# Patient Record
Sex: Male | Born: 1985 | Race: Black or African American | Hispanic: No | Marital: Single | Smoking: Never smoker
Health system: Southern US, Community
[De-identification: ages and names within clinical notes are randomized; demographics above are authoritative.]

---
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
## 2018-04-27 ENCOUNTER — Other Ambulatory Visit: Payer: Self-pay

## 2018-04-27 ENCOUNTER — Emergency Department (HOSPITAL_BASED_OUTPATIENT_CLINIC_OR_DEPARTMENT_OTHER): Payer: Self-pay

## 2018-04-27 ENCOUNTER — Emergency Department (HOSPITAL_BASED_OUTPATIENT_CLINIC_OR_DEPARTMENT_OTHER)
Admission: EM | Admit: 2018-04-27 | Discharge: 2018-04-27 | Disposition: A | Discharge status: Home / Self Care | Payer: Self-pay | Attending: Emergency Medicine | Admitting: Emergency Medicine

## 2018-04-27 ENCOUNTER — Encounter (HOSPITAL_BASED_OUTPATIENT_CLINIC_OR_DEPARTMENT_OTHER): Payer: Self-pay

## 2018-04-27 DIAGNOSIS — K859 Acute pancreatitis without necrosis or infection, unspecified: Secondary | ICD-10-CM | POA: Insufficient documentation

## 2018-04-27 LAB — COMPREHENSIVE METABOLIC PANEL
ALT: 25 U/L (ref 0–44)
AST: 22 U/L (ref 15–41)
Albumin: 3.9 g/dL (ref 3.5–5.0)
Alkaline Phosphatase: 44 U/L (ref 38–126)
Anion gap: 7 (ref 5–15)
BUN: 8 mg/dL (ref 6–20)
CO2: 28 mmol/L (ref 22–32)
Calcium: 9.6 mg/dL (ref 8.9–10.3)
Chloride: 103 mmol/L (ref 98–111)
Creatinine, Ser: 0.84 mg/dL (ref 0.61–1.24)
GFR calc Af Amer: >60 mL/min (ref >60)
GFR calc non Af Amer: >60 mL/min (ref >60)
Glucose, Bld: 137 mg/dL — ABNORMAL HIGH (ref 70–99)
Potassium: 4 mmol/L (ref 3.5–5.1)
Sodium: 138 mmol/L (ref 135–145)
Total Bilirubin: 0.5 mg/dL (ref 0.3–1.2)
Total Protein: 7.3 g/dL (ref 6.5–8.1)

## 2018-04-27 LAB — LIPASE, BLOOD: Lipase: 107 U/L — ABNORMAL HIGH (ref 11–51)

## 2018-04-27 LAB — URINALYSIS, ROUTINE W REFLEX MICROSCOPIC
BILIRUBIN URINE: NEGATIVE
Glucose, UA: NEGATIVE mg/dL
Hgb urine dipstick: NEGATIVE
Ketones, ur: NEGATIVE mg/dL
Leukocytes, UA: NEGATIVE
Nitrite: NEGATIVE
Protein, ur: NEGATIVE mg/dL
Specific Gravity, Urine: 1.02 (ref 1.005–1.030)
pH: 8 (ref 5.0–8.0)

## 2018-04-27 LAB — CBC
HCT: 46.4 % (ref 39.0–52.0)
Hemoglobin: 14.5 g/dL (ref 13.0–17.0)
MCH: 25.4 pg — ABNORMAL LOW (ref 26.0–34.0)
MCHC: 31.3 g/dL (ref 30.0–36.0)
MCV: 81.3 fL (ref 80.0–100.0)
Platelets: 271 10*3/uL (ref 150–400)
RBC: 5.71 MIL/uL (ref 4.22–5.81)
RDW: 13.3 % (ref 11.5–15.5)
WBC: 6.4 10*3/uL (ref 4.0–10.5)
nRBC: 0 % (ref 0.0–0.2)

## 2018-04-27 MED ORDER — ONDANSETRON HCL 4 MG/2ML IJ SOLN
4.0000 mg | Freq: Once | INTRAMUSCULAR | Status: AC
  Administered 2018-04-27: 4 mg via INTRAVENOUS
  Filled 2018-04-27: qty 2

## 2018-04-27 MED ORDER — SODIUM CHLORIDE 0.9 % IV BOLUS: 1000.0000 mL | Freq: Once | INTRAVENOUS | Status: DC

## 2018-04-27 MED ORDER — TRAMADOL HCL 50 MG PO TABS: 50.0000 mg | ORAL_TABLET | Freq: Four times a day (QID) | ORAL | 0 refills | Status: AC | PRN

## 2018-04-27 MED ORDER — PROMETHAZINE HCL 25 MG PO TABS: 25.0000 mg | ORAL_TABLET | Freq: Four times a day (QID) | ORAL | 0 refills | Status: AC | PRN

## 2018-04-27 MED ORDER — IOPAMIDOL (ISOVUE-300) INJECTION 61%
100.0000 mL | Freq: Once | INTRAVENOUS | Status: AC | PRN
  Administered 2018-04-27: 100 mL via INTRAVENOUS

## 2018-04-27 MED ORDER — KETOROLAC TROMETHAMINE 30 MG/ML IJ SOLN
30.0000 mg | Freq: Once | INTRAMUSCULAR | Status: AC
  Administered 2018-04-27: 30 mg via INTRAVENOUS
  Filled 2018-04-27: qty 1

## 2018-04-27 NOTE — ED Provider Notes (Signed)
[facility] EMERGENCY DEPARTMENT Provider Note   CSN: [id number] Arrival date & time: 04/27/18  1531     History   Chief Complaint Chief Complaint  Patient presents with  . Abdominal Pain    HPI Bryan Holder is a 33 y.o. male.  HPI Patient presents to the emergency department with abdominal discomfort that started 3 days ago.  Patient states he took Pepto-Bismol with some relief of his symptoms.  Patient states he had several episodes of nausea and vomiting.  The patient states that he is also had some diarrhea.  Patient states that his abdominal pain is mostly left-sided abdominal discomfort.  Patient states that nothing seems to make the condition better but palpation makes the pain worse.  The patient denies chest pain, shortness of breath, headache,blurred vision, neck pain, fever, cough, weakness, numbness, dizziness, anorexia, edema,rash, back pain, dysuria, hematemesis, bloody stool, near syncope, or syncope. History reviewed. No pertinent past medical history.  There are no active problems to display for this patient.   History reviewed. No pertinent surgical history.      Home Medications    Prior to Admission medications   Not on File    Family History No family history on file.  Social History Social History   Tobacco Use  . Smoking status: Never Smoker  . Smokeless tobacco: Never Used  Substance Use Topics  . Alcohol use: Yes    Comment: occ  . Drug use: Yes    Types: Marijuana     Allergies   Patient has no known allergies.   Review of Systems Review of Systems  All other systems negative except as documented in the HPI. All pertinent positives and negatives as reviewed in the HPI. Physical Exam Updated Vital Signs BP 107/64 (BP Location: Left Arm)   Pulse 77   Temp 98.1 F (36.7 C)   Resp 16   Ht 5\' 7"  (1.702 m)   Wt 94.3 kg   SpO2 98%   BMI 32.58 kg/m   Physical Exam Vitals signs and nursing note reviewed.    Constitutional:      General: He is not in acute distress.    Appearance: He is well-developed.  HENT:     Head: Normocephalic and atraumatic.  Eyes:     Pupils: Pupils are equal, round, and reactive to light.  Neck:     Musculoskeletal: Normal range of motion and neck supple.  Cardiovascular:     Rate and Rhythm: Normal rate and regular rhythm.     Heart sounds: Normal heart sounds. No murmur. No friction rub. No gallop.   Pulmonary:     Effort: Pulmonary effort is normal. No respiratory distress.     Breath sounds: Normal breath sounds. No wheezing.  Abdominal:     General: Bowel sounds are normal. There is no distension.     Palpations: Abdomen is soft.     Tenderness: There is abdominal tenderness in the periumbilical area and left upper quadrant.  Skin:    General: Skin is warm and dry.     Capillary Refill: Capillary refill takes less than 2 seconds.     Findings: No erythema or rash.  Neurological:     Mental Status: He is alert and oriented to person, place, and time.     Motor: No abnormal muscle tone.     Coordination: Coordination normal.  Psychiatric:        Behavior: Behavior normal.      ED Treatments /  Results  Labs (all labs ordered are listed, but only abnormal results are displayed) Labs Reviewed  LIPASE, BLOOD - Abnormal; Notable for the following components:      Result Value   Lipase 107 (*)    All other components within normal limits  COMPREHENSIVE METABOLIC PANEL - Abnormal; Notable for the following components:   Glucose, Bld 137 (*)    All other components within normal limits  CBC - Abnormal; Notable for the following components:   MCH 25.4 (*)    All other components within normal limits  URINALYSIS, ROUTINE W REFLEX MICROSCOPIC - Abnormal; Notable for the following components:   APPearance CLOUDY (*)    All other components within normal limits    EKG None  Radiology Ct Abdomen Pelvis W Contrast  Result Date: 04/27/2018 CLINICAL  DATA:  Generalized abdominal pain, nausea, vomiting, diarrhea. EXAM: CT ABDOMEN AND PELVIS WITH CONTRAST TECHNIQUE: Multidetector CT imaging of the abdomen and pelvis was performed using the standard protocol following bolus administration of intravenous contrast. CONTRAST:  100mL ISOVUE-300 IOPAMIDOL (ISOVUE-300) INJECTION 61% COMPARISON:  None. FINDINGS: Lower chest: No significant pulmonary nodules or acute consolidative airspace disease. Hepatobiliary: Normal liver size. No liver mass. Normal gallbladder with no radiopaque cholelithiasis. No biliary ductal dilatation. Pancreas: Normal, with no mass or duct dilation. Spleen: Normal size. No mass. Adrenals/Urinary Tract: Normal adrenals. Nonobstructing 6 mm lower right renal stone. No hydronephrosis. No renal masses. Normal bladder. Stomach/Bowel: Normal non-distended stomach. Normal caliber small bowel with no small bowel wall thickening. Normal appendix. There is slight haziness of the pericolonic fat throughout the right colon, without significant colonic wall thickening or diverticulosis. Vascular/Lymphatic: Normal caliber abdominal aorta. Patent portal, splenic, hepatic and renal veins. No pathologically enlarged lymph nodes in the abdomen or pelvis. Reproductive: Normal size prostate. Other: No pneumoperitoneum, ascites or focal fluid collection. Musculoskeletal: No aggressive appearing focal osseous lesions. IMPRESSION: 1. Slight haziness of the pericolonic fat throughout the right colon, without significant colonic wall thickening or diverticulosis. Cannot exclude a mild nonspecific infectious or inflammatory colitis. No bowel obstruction. 2. Nonobstructing right nephrolithiasis. Electronically Signed   By: Delbert PhenixJason A Poff M.D.   On: 04/27/2018 19:04    Procedures Procedures (including critical care time)  Medications Ordered in ED Medications  ondansetron (ZOFRAN) injection 4 mg (4 mg Intravenous Given 04/27/18 1750)  iopamidol (ISOVUE-300) 61 %  injection 100 mL (100 mLs Intravenous Contrast Given 04/27/18 1825)     Initial Impression / Assessment and Plan / ED Course  I have reviewed the triage vital signs and the nursing notes.  Pertinent labs & imaging results that were available during my care of the patient were reviewed by me and considered in my medical decision making (see chart for details).     Patient most likely has pancreatitis based on his elevated lipase.  Patient is advised to return here as needed.  Put on a clear liquid diet and advised to avoid alcohol.  Patient agrees the plan and all questions were answered.  The patient is been stable here in the emergency department I feel is safe for discharge at this time. Final Clinical Impressions(s) / ED Diagnoses   Final diagnoses:  None    ED Discharge Orders    None       Charlestine NightLawyer, Zaylan Kissoon, PA-C 04/27/18 1951    Loren RacerYelverton, David, MD 04/29/18 [id number]

## 2018-04-27 NOTE — ED Triage Notes (Signed)
C/o abd pain started yesterday-n/v/d x today-NAD-steady gait

## 2018-04-27 NOTE — ED Notes (Signed)
ED Provider at bedside. 

## 2018-04-27 NOTE — ED Notes (Signed)
Pt c/o n/v/d since last night. Pt states abdominal pain with diarrhea and emesis. Last emesis episode at 10am today.

## 2018-04-27 NOTE — Discharge Instructions (Addendum)
Return here as needed. Follow up with a primary doctor. °

## 2018-04-27 NOTE — ED Notes (Signed)
Patient transported to CT 

## 2018-09-09 ENCOUNTER — Other Ambulatory Visit: Payer: Self-pay

## 2018-09-09 ENCOUNTER — Emergency Department (HOSPITAL_BASED_OUTPATIENT_CLINIC_OR_DEPARTMENT_OTHER)
Admission: EM | Admit: 2018-09-09 | Discharge: 2018-09-09 | Disposition: A | Discharge status: Home / Self Care | Payer: No Typology Code available for payment source | Attending: Emergency Medicine | Admitting: Emergency Medicine

## 2018-09-09 ENCOUNTER — Encounter (HOSPITAL_BASED_OUTPATIENT_CLINIC_OR_DEPARTMENT_OTHER): Payer: Self-pay | Admitting: Emergency Medicine

## 2018-09-09 ENCOUNTER — Emergency Department (HOSPITAL_BASED_OUTPATIENT_CLINIC_OR_DEPARTMENT_OTHER): Payer: No Typology Code available for payment source

## 2018-09-09 DIAGNOSIS — R079 Chest pain, unspecified: Secondary | ICD-10-CM | POA: Insufficient documentation

## 2018-09-09 DIAGNOSIS — Y9389 Activity, other specified: Secondary | ICD-10-CM | POA: Insufficient documentation

## 2018-09-09 DIAGNOSIS — M79631 Pain in right forearm: Secondary | ICD-10-CM | POA: Diagnosis not present

## 2018-09-09 DIAGNOSIS — R0602 Shortness of breath: Secondary | ICD-10-CM | POA: Insufficient documentation

## 2018-09-09 DIAGNOSIS — Y92410 Unspecified street and highway as the place of occurrence of the external cause: Secondary | ICD-10-CM | POA: Insufficient documentation

## 2018-09-09 DIAGNOSIS — M25511 Pain in right shoulder: Secondary | ICD-10-CM | POA: Diagnosis not present

## 2018-09-09 DIAGNOSIS — S3991XA Unspecified injury of abdomen, initial encounter: Secondary | ICD-10-CM | POA: Diagnosis present

## 2018-09-09 DIAGNOSIS — M7918 Myalgia, other site: Secondary | ICD-10-CM

## 2018-09-09 DIAGNOSIS — Y999 Unspecified external cause status: Secondary | ICD-10-CM | POA: Insufficient documentation

## 2018-09-09 LAB — CBC WITH DIFFERENTIAL/PLATELET
Abs Immature Granulocytes: 0.02 10*3/uL (ref 0.00–0.07)
Basophils Absolute: 0 10*3/uL (ref 0.0–0.1)
Basophils Relative: 0 %
Eosinophils Absolute: 0.1 10*3/uL (ref 0.0–0.5)
Eosinophils Relative: 1 %
HCT: 43.2 % (ref 39.0–52.0)
Hemoglobin: 13.7 g/dL (ref 13.0–17.0)
Immature Granulocytes: 0 %
Lymphocytes Relative: 42 %
Lymphs Abs: 2.5 10*3/uL (ref 0.7–4.0)
MCH: 26 pg (ref 26.0–34.0)
MCHC: 31.7 g/dL (ref 30.0–36.0)
MCV: 82 fL (ref 80.0–100.0)
Monocytes Absolute: 0.8 10*3/uL (ref 0.1–1.0)
Monocytes Relative: 13 %
Neutro Abs: 2.6 10*3/uL (ref 1.7–7.7)
Neutrophils Relative %: 44 %
Platelets: 280 10*3/uL (ref 150–400)
RBC: 5.27 MIL/uL (ref 4.22–5.81)
RDW: 13.5 % (ref 11.5–15.5)
WBC: 6 10*3/uL (ref 4.0–10.5)
nRBC: 0 % (ref 0.0–0.2)

## 2018-09-09 LAB — COMPREHENSIVE METABOLIC PANEL
ALT: 22 U/L (ref 0–44)
AST: 22 U/L (ref 15–41)
Albumin: 4.2 g/dL (ref 3.5–5.0)
Alkaline Phosphatase: 44 U/L (ref 38–126)
Anion gap: 8 (ref 5–15)
BUN: 15 mg/dL (ref 6–20)
CO2: 24 mmol/L (ref 22–32)
Calcium: 8.8 mg/dL — ABNORMAL LOW (ref 8.9–10.3)
Chloride: 108 mmol/L (ref 98–111)
Creatinine, Ser: 0.82 mg/dL (ref 0.61–1.24)
GFR calc Af Amer: >60 mL/min (ref >60)
GFR calc non Af Amer: >60 mL/min (ref >60)
Glucose, Bld: 86 mg/dL (ref 70–99)
Potassium: 3.5 mmol/L (ref 3.5–5.1)
Sodium: 140 mmol/L (ref 135–145)
Total Bilirubin: 0.7 mg/dL (ref 0.3–1.2)
Total Protein: 7.6 g/dL (ref 6.5–8.1)

## 2018-09-09 LAB — URINALYSIS, ROUTINE W REFLEX MICROSCOPIC
Bilirubin Urine: NEGATIVE
Glucose, UA: NEGATIVE mg/dL
Hgb urine dipstick: NEGATIVE
Ketones, ur: NEGATIVE mg/dL
Leukocytes,Ua: NEGATIVE
Nitrite: NEGATIVE
Protein, ur: NEGATIVE mg/dL
Specific Gravity, Urine: >1.03 — ABNORMAL HIGH (ref 1.005–1.030)
pH: 6 (ref 5.0–8.0)

## 2018-09-09 LAB — LIPASE, BLOOD: Lipase: 26 U/L (ref 11–51)

## 2018-09-09 MED ORDER — IOHEXOL 300 MG/ML  SOLN
100.0000 mL | Freq: Once | INTRAMUSCULAR | Status: AC | PRN
  Administered 2018-09-09: 100 mL via INTRAVENOUS

## 2018-09-09 MED ORDER — MORPHINE SULFATE (PF) 4 MG/ML IV SOLN
4.0000 mg | Freq: Once | INTRAVENOUS | Status: AC
  Administered 2018-09-09: 4 mg via INTRAVENOUS
  Filled 2018-09-09: qty 1

## 2018-09-09 MED ORDER — ONDANSETRON HCL 4 MG/2ML IJ SOLN
4.0000 mg | Freq: Once | INTRAMUSCULAR | Status: AC
  Administered 2018-09-09: 4 mg via INTRAVENOUS
  Filled 2018-09-09: qty 2

## 2018-09-09 MED ORDER — SODIUM CHLORIDE 0.9 % IV BOLUS
1000.0000 mL | Freq: Once | INTRAVENOUS | Status: AC
  Administered 2018-09-09: 1000 mL via INTRAVENOUS

## 2018-09-09 NOTE — Discharge Instructions (Addendum)
Home to rest, take Motrin/Tylenol as needed as directed. Follow up with primary care provider, referral given.

## 2018-09-09 NOTE — ED Notes (Signed)
Pt verbalized understanding of needing driver r/t receiving pain medication. Pt aware we need urine specimen. Urinal at bedside for patient, pt informed to call when urine provided.

## 2018-09-09 NOTE — ED Notes (Signed)
Pt states he has called a ride to pick him up. Note given for work. Pt ambulated with steady gait to d/c window
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
## 2019-02-28 ENCOUNTER — Encounter (HOSPITAL_BASED_OUTPATIENT_CLINIC_OR_DEPARTMENT_OTHER): Payer: Self-pay

## 2019-02-28 ENCOUNTER — Emergency Department (HOSPITAL_BASED_OUTPATIENT_CLINIC_OR_DEPARTMENT_OTHER): Payer: Self-pay

## 2019-02-28 ENCOUNTER — Other Ambulatory Visit: Payer: Self-pay

## 2019-02-28 ENCOUNTER — Emergency Department (HOSPITAL_BASED_OUTPATIENT_CLINIC_OR_DEPARTMENT_OTHER)
Admission: EM | Admit: 2019-02-28 | Discharge: 2019-02-28 | Disposition: A | Discharge status: Home / Self Care | Payer: Self-pay | Attending: Emergency Medicine | Admitting: Emergency Medicine

## 2019-02-28 DIAGNOSIS — N200 Calculus of kidney: Secondary | ICD-10-CM | POA: Insufficient documentation

## 2019-02-28 DIAGNOSIS — R109 Unspecified abdominal pain: Secondary | ICD-10-CM

## 2019-02-28 DIAGNOSIS — R1031 Right lower quadrant pain: Secondary | ICD-10-CM | POA: Insufficient documentation

## 2019-02-28 LAB — URINALYSIS, ROUTINE W REFLEX MICROSCOPIC
Bilirubin Urine: NEGATIVE
Glucose, UA: NEGATIVE mg/dL
Hgb urine dipstick: NEGATIVE
Ketones, ur: NEGATIVE mg/dL
Leukocytes,Ua: NEGATIVE
Nitrite: NEGATIVE
Protein, ur: NEGATIVE mg/dL
Specific Gravity, Urine: 1.02 (ref 1.005–1.030)
pH: 7 (ref 5.0–8.0)

## 2019-02-28 MED ORDER — OXYCODONE HCL 5 MG PO TABS
5.0000 mg | ORAL_TABLET | Freq: Once | ORAL | Status: AC
  Administered 2019-02-28: 20:00:00 5 mg via ORAL
  Filled 2019-02-28: qty 1

## 2019-02-28 MED ORDER — ACETAMINOPHEN 500 MG PO TABS
1000.0000 mg | ORAL_TABLET | Freq: Once | ORAL | Status: AC
  Administered 2019-02-28: 1000 mg via ORAL
  Filled 2019-02-28: qty 2

## 2019-02-28 MED ORDER — KETOROLAC TROMETHAMINE 15 MG/ML IJ SOLN
15.0000 mg | Freq: Once | INTRAMUSCULAR | Status: AC
  Administered 2019-02-28: 15 mg via INTRAMUSCULAR
  Filled 2019-02-28: qty 1

## 2019-02-28 NOTE — ED Provider Notes (Signed)
[place] EMERGENCY DEPARTMENT Provider Note   CSN: [id number] Arrival date & time: 02/28/19  1818     History   Chief Complaint Chief Complaint  Patient presents with  . Flank Pain    HPI Bryan Holder is a 33 y.o. male.     33 yo M with a chief complaint of right flank pain.  Going on for the past 3 to 4 days.  The patient states that it started initially with eating [place] food and then he became ill with vomiting and diarrhea.  Have been having some right flank pain that seems to come and go.  When it comes he has some significant discomfort.  No nausea or vomiting now.  No fevers.  Feels like his urine has been darker than normal.  No history of kidney stones.  No trauma.  The history is provided by the patient.  Flank Pain This is a new problem. The current episode started 2 days ago. The problem occurs constantly. The problem has not changed since onset.Pertinent negatives include no chest pain, no abdominal pain, no headaches and no shortness of breath. Nothing aggravates the symptoms. Nothing relieves the symptoms. He has tried nothing for the symptoms. The treatment provided no relief.    History reviewed. No pertinent past medical history.  There are no active problems to display for this patient.   History reviewed. No pertinent surgical history.      Home Medications    Prior to Admission medications   Medication Sig Start Date End Date Taking? Authorizing Provider  promethazine (PHENERGAN) 25 MG tablet Take 1 tablet (25 mg total) by mouth every 6 (six) hours as needed for nausea or vomiting. 04/27/18   Lawyer, Harrell Gave, PA-C  traMADol (ULTRAM) 50 MG tablet Take 1 tablet (50 mg total) by mouth every 6 (six) hours as needed for severe pain. 04/27/18   Dalia Heading, PA-C    Family History No family history on file.  Social History Social History   Tobacco Use  . Smoking status: Never Smoker  . Smokeless tobacco: Never Used   Substance Use Topics  . Alcohol use: Not Currently  . Drug use: Yes    Types: Marijuana     Allergies   Patient has no known allergies.   Review of Systems Review of Systems  Constitutional: Negative for chills and fever.  HENT: Negative for congestion and facial swelling.   Eyes: Negative for discharge and visual disturbance.  Respiratory: Negative for shortness of breath.   Cardiovascular: Negative for chest pain and palpitations.  Gastrointestinal: Negative for abdominal pain, diarrhea and vomiting.  Genitourinary: Positive for flank pain.  Musculoskeletal: Negative for arthralgias and myalgias.  Skin: Negative for color change and rash.  Neurological: Negative for tremors, syncope and headaches.  Psychiatric/Behavioral: Negative for confusion and dysphoric mood.     Physical Exam Updated Vital Signs BP (!) 148/90 (BP Location: Left Arm)   Pulse 88   Temp 98.8 F (37.1 C) (Oral)   Resp 14   Ht 5\' 6"  (1.676 m)   Wt 90.5 kg   SpO2 100%   BMI 32.20 kg/m   Physical Exam Vitals signs and nursing note reviewed.  Constitutional:      Appearance: He is well-developed.  HENT:     Head: Normocephalic and atraumatic.  Eyes:     Pupils: Pupils are equal, round, and reactive to light.  Neck:     Musculoskeletal: Normal range of motion and neck supple.  Vascular: No JVD.  Cardiovascular:     Rate and Rhythm: Normal rate and regular rhythm.     Heart sounds: No murmur. No friction rub. No gallop.   Pulmonary:     Effort: No respiratory distress.     Breath sounds: No wheezing.  Abdominal:     General: There is no distension.     Tenderness: There is no abdominal tenderness. There is no guarding or rebound.  Musculoskeletal: Normal range of motion.  Skin:    Coloration: Skin is not pale.     Findings: No rash.  Neurological:     Mental Status: He is alert and oriented to person, place, and time.  Psychiatric:        Behavior: Behavior normal.      ED  Treatments / Results  Labs (all labs ordered are listed, but only abnormal results are displayed) Labs Reviewed  URINALYSIS, ROUTINE W REFLEX MICROSCOPIC    EKG None  Radiology Ct Renal Stone Study  Result Date: 02/28/2019 CLINICAL DATA:  33 y/o male with right flank pain x2 weeks, worsening. Also c/o nausea, emesis, diarrhea, dark urine. EXAM: CT ABDOMEN AND PELVIS WITHOUT CONTRAST TECHNIQUE: Multidetector CT imaging of the abdomen and pelvis was performed following the standard protocol without IV contrast. COMPARISON:  CT abdomen pelvis 09/09/2018 FINDINGS: Lower chest: No acute abnormality. Somewhat limited evaluation of the abdominal viscera given the lack of IV contrast Hepatobiliary: No focal liver abnormality is seen. No gallstones, gallbladder wall thickening, or biliary dilatation. Pancreas: Unremarkable Spleen: Normal in size without focal abnormality. Adrenals/Urinary Tract: Adrenal glands are unremarkable. Kidneys are symmetric in size. No mass identified. There is a 5 mm calculus in the right kidney. No hydronephrosis. No left renal calculi. Bladder is unremarkable. Stomach/Bowel: Stomach is within normal limits. Appendix appears normal. No evidence of bowel wall thickening, distention, or inflammatory changes. Vascular/Lymphatic: No significant vascular findings are present. No enlarged abdominal or pelvic lymph nodes. Reproductive: Prostate is unremarkable. Other: No abdominal wall hernia or abnormality. No abdominopelvic ascites. Musculoskeletal: No acute or significant osseous findings. IMPRESSION: 1. 5 mm nonobstructing calculus in the right kidney. 2. No other acute findings in the abdomen or pelvis to explain the patient's symptoms. It has Electronically Signed   By: Emmaline KluverNancy  Ballantyne M.D.   On: 02/28/2019 20:43    Procedures Procedures (including critical care time)  Medications Ordered in ED Medications  acetaminophen (TYLENOL) tablet 1,000 mg (1,000 mg Oral Given 02/28/19  1941)  ketorolac (TORADOL) 15 MG/ML injection 15 mg (15 mg Intramuscular Given 02/28/19 1942)  oxyCODONE (Oxy IR/ROXICODONE) immediate release tablet 5 mg (5 mg Oral Given 02/28/19 1941)     Initial Impression / Assessment and Plan / ED Course  I have reviewed the triage vital signs and the nursing notes.  Pertinent labs & imaging results that were available during my care of the patient were reviewed by me and considered in my medical decision making (see chart for details).        33 yo M with a chief complaint of right flank pain.  Going on for the past 3 to 4 days.  No history of stones though the history could be consistent with.  He is well-appearing and nontoxic.  UA is negative for infection or significant concentration.  And I feel that labs are required.  As he is never had a stone before will obtain a stone study to evaluate.  CT stone study is negative for acute pathology.  I am  unsure the exact cause of the patient's symptoms though we will treat at this point is musculoskeletal.  We will have him follow-up with his family doctor in the office.  11:05 PM:  I have discussed the diagnosis/risks/treatment options with the patient and believe the pt to be eligible for discharge home to follow-up with PCP. We also discussed returning to the ED immediately if new or worsening sx occur. We discussed the sx which are most concerning (e.g., sudden worsening pain, fever, inability to tolerate by mouth) that necessitate immediate return. Medications administered to the patient during their visit and any new prescriptions provided to the patient are listed below.  Medications given during this visit Medications  acetaminophen (TYLENOL) tablet 1,000 mg (1,000 mg Oral Given 02/28/19 1941)  ketorolac (TORADOL) 15 MG/ML injection 15 mg (15 mg Intramuscular Given 02/28/19 1942)  oxyCODONE (Oxy IR/ROXICODONE) immediate release tablet 5 mg (5 mg Oral Given 02/28/19 1941)     The patient appears  reasonably screen and/or stabilized for discharge and I doubt any other medical condition or other [facility] requiring further screening, evaluation, or treatment in the ED at this time prior to discharge.    Final Clinical Impressions(s) / ED Diagnoses   Final diagnoses:  Right flank pain    ED Discharge Orders    None       Melene Plan, DO 02/28/19 2305

## 2019-02-28 NOTE — ED Notes (Signed)
ED Provider at bedside. 

## 2019-02-28 NOTE — Discharge Instructions (Signed)
Take 4 over the counter ibuprofen tablets 3 times a day or 2 over-the-counter naproxen tablets twice a day for pain. Also take tylenol 1000mg(2 extra strength) four times a day.    

## 2019-02-28 NOTE — ED Triage Notes (Signed)
Pt c/o right flank pain, n/v/d, dark urine-sx started 2 weeks ago-NAD-steady gait

## 2019-02-28 NOTE — ED Notes (Signed)
Pt calm, texting on phone. No acute distress noted.
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
## 2024-05-04 ENCOUNTER — Other Ambulatory Visit: Payer: Self-pay

## 2024-05-04 ENCOUNTER — Emergency Department (HOSPITAL_BASED_OUTPATIENT_CLINIC_OR_DEPARTMENT_OTHER)
Admission: EM | Admit: 2024-05-04 | Discharge: 2024-05-04 | Disposition: A | Discharge status: Home / Self Care | Payer: Self-pay

## 2024-05-04 ENCOUNTER — Encounter (HOSPITAL_BASED_OUTPATIENT_CLINIC_OR_DEPARTMENT_OTHER): Payer: Self-pay

## 2024-05-04 DIAGNOSIS — Z202 Contact with and (suspected) exposure to infections with a predominantly sexual mode of transmission: Secondary | ICD-10-CM | POA: Insufficient documentation

## 2024-05-04 DIAGNOSIS — K859 Acute pancreatitis without necrosis or infection, unspecified: Secondary | ICD-10-CM | POA: Insufficient documentation

## 2024-05-04 LAB — LIPASE, BLOOD: Lipase: 117 U/L — ABNORMAL HIGH (ref 11–51)

## 2024-05-04 LAB — URINALYSIS, MICROSCOPIC (REFLEX): RBC / HPF: NONE SEEN RBC/hpf (ref 0–5)

## 2024-05-04 LAB — COMPREHENSIVE METABOLIC PANEL WITH GFR
ALT: 43 U/L (ref 0–44)
AST: 20 U/L (ref 15–41)
Albumin: 4.5 g/dL (ref 3.5–5.0)
Alkaline Phosphatase: 60 U/L (ref 38–126)
Anion gap: 12 (ref 5–15)
BUN: 13 mg/dL (ref 6–20)
CO2: 25 mmol/L (ref 22–32)
Calcium: 9.3 mg/dL (ref 8.9–10.3)
Chloride: 102 mmol/L (ref 98–111)
Creatinine, Ser: 0.8 mg/dL (ref 0.61–1.24)
GFR, Estimated: >60 mL/min
Glucose, Bld: 134 mg/dL — ABNORMAL HIGH (ref 70–99)
Potassium: 3.9 mmol/L (ref 3.5–5.1)
Sodium: 139 mmol/L (ref 135–145)
Total Bilirubin: 0.6 mg/dL (ref 0.0–1.2)
Total Protein: 7.4 g/dL (ref 6.5–8.1)

## 2024-05-04 LAB — URINALYSIS, ROUTINE W REFLEX MICROSCOPIC
Bilirubin Urine: NEGATIVE
Glucose, UA: NEGATIVE mg/dL
Hgb urine dipstick: NEGATIVE
Ketones, ur: NEGATIVE mg/dL
Nitrite: NEGATIVE
Protein, ur: NEGATIVE mg/dL
Specific Gravity, Urine: >=1.03 (ref 1.005–1.030)
pH: 6 (ref 5.0–8.0)

## 2024-05-04 LAB — CBC
HCT: 42.8 % (ref 39.0–52.0)
Hemoglobin: 14.1 g/dL (ref 13.0–17.0)
MCH: 26.6 pg (ref 26.0–34.0)
MCHC: 32.9 g/dL (ref 30.0–36.0)
MCV: 80.6 fL (ref 80.0–100.0)
Platelets: 299 K/uL (ref 150–400)
RBC: 5.31 MIL/uL (ref 4.22–5.81)
RDW: 12.6 % (ref 11.5–15.5)
WBC: 7.4 K/uL (ref 4.0–10.5)
nRBC: 0 % (ref 0.0–0.2)

## 2024-05-04 MED ORDER — DOXYCYCLINE HYCLATE 100 MG PO TABS
100.0000 mg | ORAL_TABLET | Freq: Once | ORAL | Status: AC
  Administered 2024-05-04: 100 mg via ORAL
  Filled 2024-05-04: qty 1

## 2024-05-04 MED ORDER — KETOROLAC TROMETHAMINE 15 MG/ML IJ SOLN
15.0000 mg | Freq: Once | INTRAMUSCULAR | Status: AC
  Administered 2024-05-04: 15 mg via INTRAMUSCULAR
  Filled 2024-05-04: qty 1

## 2024-05-04 MED ORDER — DOXYCYCLINE HYCLATE 100 MG PO CAPS: 100.0000 mg | ORAL_CAPSULE | Freq: Two times a day (BID) | ORAL | 0 refills | Status: AC

## 2024-05-04 MED ORDER — CEFTRIAXONE SODIUM 1 G IJ SOLR
1.0000 g | Freq: Once | INTRAMUSCULAR | Status: AC
  Administered 2024-05-04: 1 g via INTRAMUSCULAR
  Filled 2024-05-04: qty 10

## 2024-05-04 NOTE — ED Provider Notes (Signed)
 " [place] EMERGENCY DEPARTMENT AT [facility] Provider Note   CSN: [id number] Arrival date & time: 05/04/24  2022     Patient presents with: Abdominal Pain   Bryan Holder is a 39 y.o. male.   39 year old male presents for evaluation of abdominal pain and encounter for STD testing.  States he thinks he was exposed.  He said some abdominal pain for the last couple days but denies any nausea or vomiting or diarrhea.  States is epigastric and radiates to his back.  Denies any other symptoms or concerns.   Abdominal Pain Associated symptoms: no chest pain, no chills, no cough, no dysuria, no fever, no hematuria, no shortness of breath, no sore throat and no vomiting        Prior to Admission medications  Medication Sig Start Date End Date Taking? Authorizing Provider  doxycycline  (VIBRAMYCIN ) 100 MG capsule Take 1 capsule (100 mg total) by mouth 2 (two) times daily for 7 days. 05/04/24 05/11/24 Yes Demareon Coldwell L, DO  promethazine  (PHENERGAN ) 25 MG tablet Take 1 tablet (25 mg total) by mouth every 6 (six) hours as needed for nausea or vomiting. 04/27/18   Lawyer, Lonni, PA-C  traMADol  (ULTRAM ) 50 MG tablet Take 1 tablet (50 mg total) by mouth every 6 (six) hours as needed for severe pain. 04/27/18   Lawyer, Lonni, PA-C    Allergies: Patient has no known allergies.    Review of Systems  Constitutional:  Negative for chills and fever.  HENT:  Negative for ear pain and sore throat.   Eyes:  Negative for pain and visual disturbance.  Respiratory:  Negative for cough and shortness of breath.   Cardiovascular:  Negative for chest pain and palpitations.  Gastrointestinal:  Positive for abdominal pain. Negative for vomiting.  Genitourinary:  Negative for dysuria and hematuria.  Musculoskeletal:  Negative for arthralgias and back pain.  Skin:  Negative for color change and rash.  Neurological:  Negative for seizures and syncope.  All other systems reviewed and are  negative.   Updated Vital Signs BP (!) 137/98   Pulse (!) 114   Temp 98.1 F (36.7 C) (Oral)   Resp 16   Ht 5' 7 (1.702 m)   Wt 102.1 kg   SpO2 98%   BMI 35.24 kg/m   Physical Exam Vitals and nursing note reviewed.  Constitutional:      General: He is not in acute distress.    Appearance: He is well-developed.  HENT:     Head: Normocephalic and atraumatic.  Eyes:     Conjunctiva/sclera: Conjunctivae normal.  Cardiovascular:     Rate and Rhythm: Normal rate and regular rhythm.     Heart sounds: No murmur heard. Pulmonary:     Effort: Pulmonary effort is normal. No respiratory distress.     Breath sounds: Normal breath sounds.  Abdominal:     Palpations: Abdomen is soft.     Tenderness: There is abdominal tenderness.     Comments: Mild epigastric tenderness to palpation  Musculoskeletal:        General: No swelling.     Cervical back: Neck supple.  Skin:    General: Skin is warm and dry.     Capillary Refill: Capillary refill takes less than 2 seconds.  Neurological:     Mental Status: He is alert.  Psychiatric:        Mood and Affect: Mood normal.     (all labs ordered are listed, but only  abnormal results are displayed) Labs Reviewed  LIPASE, BLOOD - Abnormal; Notable for the following components:      Result Value   Lipase 117 (*)    All other components within normal limits  COMPREHENSIVE METABOLIC PANEL WITH GFR - Abnormal; Notable for the following components:   Glucose, Bld 134 (*)    All other components within normal limits  URINALYSIS, ROUTINE W REFLEX MICROSCOPIC - Abnormal; Notable for the following components:   APPearance HAZY (*)    Leukocytes,Ua TRACE (*)    All other components within normal limits  URINALYSIS, MICROSCOPIC (REFLEX) - Abnormal; Notable for the following components:   Bacteria, UA RARE (*)    All other components within normal limits  CBC  GC/CHLAMYDIA PROBE AMP ([place]) NOT AT [facility]    EKG: None  Radiology: No  results found.   Procedures   Medications Ordered in the ED  ketorolac  (TORADOL ) 15 MG/ML injection 15 mg (has no administration in time range)  cefTRIAXone  (ROCEPHIN ) injection 1 g (has no administration in time range)  doxycycline  (VIBRA -TABS) tablet 100 mg (has no administration in time range)                                    Medical Decision Making Patient treated for STD exposure with Rocephin  and Doxy.  Will give him his prescription for this.  He was also given Toradol  here for his abdominal pain.  Lab reviewed and shows mild elevation in his lipase consistent with very mild pancreatitis.  Labs otherwise fairly unremarkable and he appears stable.  Advise clear liquid diet for the next 24 hours and slowly advance diet.  Advised Tylenol  Motrin as needed for pain.  Given return precautions and advised to avoid sexual contact until he finishes his antibiotics.  He feels comfortable to plan be discharged home.  Advised to follow-up his MyChart app results for the STD test.  Problems Addressed: Acute pancreatitis, unspecified complication status, unspecified pancreatitis type: acute illness or injury Exposure to STD: acute illness or injury  Amount and/or Complexity of Data Reviewed External Data Reviewed: notes.    Details: Prior ED records reviewed and patient seen on 02-28-2019 for flank pain Labs: ordered. Decision-making details documented in ED Course.    Details: Ordered and reviewed by me and lipase slightly above normal  Risk OTC drugs. Prescription drug management.     Final diagnoses:  Acute pancreatitis, unspecified complication status, unspecified pancreatitis type  Exposure to STD    ED Discharge Orders          Ordered    doxycycline  (VIBRAMYCIN ) 100 MG capsule  2 times daily        05/04/24 2341               Gennaro Bouchard L, DO 05/04/24 2344  "

## 2024-05-04 NOTE — ED Triage Notes (Signed)
 Abd pain x2 days. Denies N/V/D. Pt states that he also wants to be tested for STDs.

## 2024-05-04 NOTE — Discharge Instructions (Signed)
 Take your doxycycline  as prescribed for the full course unless your MyChart result for STD testing is negative then you can stop it.  You can use Tylenol  Motrin as needed for pain.  You should keep a clear liquid diet for the next 24 hours and then after that you can advance your diet as tolerated.

## 2024-05-07 LAB — GC/CHLAMYDIA PROBE AMP (~~LOC~~) NOT AT ARMC
Chlamydia: NEGATIVE
Comment: NEGATIVE
Comment: NORMAL
Neisseria Gonorrhea: NEGATIVE
